# Patient Record
Sex: Male | Born: 2000 | Race: Black or African American | Hispanic: No | Marital: Single | Smoking: Never smoker
Health system: Southern US, Community
[De-identification: ages and names within clinical notes are randomized; demographics above are authoritative.]

## PROBLEM LIST (undated history)

## (undated) ENCOUNTER — Ambulatory Visit: Payer: Self-pay

## (undated) ENCOUNTER — Ambulatory Visit: Admission: EM | Discharge status: Absent without leave | Payer: Self-pay

---

## 2021-06-15 ENCOUNTER — Ambulatory Visit: Admit: 2021-06-15 | Payer: Self-pay

## 2021-06-15 ENCOUNTER — Ambulatory Visit: Admission: EM | Admit: 2021-06-15 | Payer: Self-pay

## 2021-06-15 ENCOUNTER — Other Ambulatory Visit: Payer: Self-pay

## 2021-06-15 ENCOUNTER — Other Ambulatory Visit: Payer: Self-pay | Admitting: Emergency Medicine

## 2021-06-15 ENCOUNTER — Ambulatory Visit (HOSPITAL_BASED_OUTPATIENT_CLINIC_OR_DEPARTMENT_OTHER)
Admission: RE | Admit: 2021-06-15 | Discharge: 2021-06-15 | Disposition: A | Discharge status: Home / Self Care | Payer: 59 | Source: Ambulatory Visit | Attending: Emergency Medicine | Admitting: Emergency Medicine

## 2021-06-15 ENCOUNTER — Ambulatory Visit
Admission: EM | Admit: 2021-06-15 | Discharge: 2021-06-15 | Disposition: A | Discharge status: Home / Self Care | Payer: 59

## 2021-06-15 ENCOUNTER — Ambulatory Visit
Admission: EM | Admit: 2021-06-15 | Discharge: 2021-06-15 | Discharge status: Absent without leave | Payer: 59 | Source: Home / Self Care | Attending: Emergency Medicine | Admitting: Emergency Medicine

## 2021-06-15 ENCOUNTER — Encounter: Payer: Self-pay | Admitting: Emergency Medicine

## 2021-06-15 DIAGNOSIS — T1490XA Injury, unspecified, initial encounter: Secondary | ICD-10-CM | POA: Insufficient documentation

## 2021-06-15 DIAGNOSIS — S99911A Unspecified injury of right ankle, initial encounter: Secondary | ICD-10-CM | POA: Diagnosis not present

## 2021-06-15 DIAGNOSIS — S93401A Sprain of unspecified ligament of right ankle, initial encounter: Secondary | ICD-10-CM

## 2021-06-15 NOTE — ED Notes (Addendum)
This writer was unable to transfer patient into a room due to error message on Epic. This Diplomatic Services operational officer aware.This Clinical research associate is waiting for registration to make a correction on patients chart before triage can occur.

## 2021-06-15 NOTE — ED Provider Notes (Signed)
[facility]    CSN: [id number] Arrival date & time: 06/15/21  1556      History   Chief Complaint Chief Complaint  Patient presents with   Ankle Pain    HPI Wesley Morris is a 20 y.o. male presenting today for evaluation of right ankle injury.  Reports that last night he was playing soccer was kicked in the ankle and caused his foot to rotate into a position causing pain.  Since he has had pain with weightbearing.  Continued pain daily to the inner aspect of his ankle, but also on the outside.  Denies history of prior fractures.  HPI  History reviewed. No pertinent past medical history.  There are no problems to display for this patient.   History reviewed. No pertinent surgical history.     Home Medications    Prior to Admission medications   Not on File    Family History History reviewed. No pertinent family history.  Social History Social History   Tobacco Use   Smoking status: Never   Smokeless tobacco: Never  Substance Use Topics   Alcohol use: Never   Drug use: Never     Allergies   Patient has no known allergies.   Review of Systems Review of Systems  Constitutional:  Negative for fatigue and fever.  Eyes:  Negative for redness, itching and visual disturbance.  Respiratory:  Negative for shortness of breath.   Cardiovascular:  Negative for chest pain and leg swelling.  Gastrointestinal:  Negative for nausea and vomiting.  Musculoskeletal:  Positive for arthralgias and joint swelling. Negative for myalgias.  Skin:  Negative for color change, rash and wound.  Neurological:  Negative for dizziness, syncope, weakness, light-headedness and headaches.    Physical Exam Triage Vital Signs ED Triage Vitals  Enc Vitals Group     BP 06/15/21 1032 (!) 143/96     Pulse Rate 06/15/21 1032 68     Resp 06/15/21 1032 18     Temp 06/15/21 1032 98.6 F (37 C)     Temp Source 06/15/21 1032 Oral     SpO2 06/15/21 1032 97 %     Weight --       Height --      Head Circumference --      Peak Flow --      Pain Score 06/15/21 1033 9     Pain Loc --      Pain Edu? --      Excl. in GC? --    No data found.  Updated Vital Signs BP (!) 143/96 (BP Location: Left Arm)   Pulse 68   Temp 98.6 F (37 C) (Oral)   Resp 18   SpO2 97%   Visual Acuity Right Eye Distance:   Left Eye Distance:   Bilateral Distance:    Right Eye Near:   Left Eye Near:    Bilateral Near:     Physical Exam Vitals and nursing note reviewed.  Constitutional:      Appearance: He is well-developed.     Comments: No acute distress  HENT:     Head: Normocephalic and atraumatic.     Nose: Nose normal.  Eyes:     Conjunctiva/sclera: Conjunctivae normal.  Cardiovascular:     Rate and Rhythm: Normal rate.  Pulmonary:     Effort: Pulmonary effort is normal. No respiratory distress.  Abdominal:     General: There is no distension.  Musculoskeletal:  General: Normal range of motion.     Cervical back: Neck supple.     Comments: Right ankle: No obvious swelling or deformity, tenderness to palpation to medial malleolus, mild tenderness over lateral malleolus as well, nontender anteriorly or throughout dorsum of foot, no tenderness along Achilles, Achilles feels firm and intact, dorsalis pedis 2+  Skin:    General: Skin is warm and dry.  Neurological:     Mental Status: He is alert and oriented to person, place, and time.     UC Treatments / Results  Labs (all labs ordered are listed, but only abnormal results are displayed) Labs Reviewed - No data to display  EKG   Radiology DG Ankle Complete Right  Result Date: 06/15/2021 CLINICAL DATA:  Injury yesterday, medial and lateral malleolar pain EXAM: RIGHT ANKLE - COMPLETE 3+ VIEW COMPARISON:  None. FINDINGS: Frontal, oblique, and lateral views of the right ankle are obtained. No fracture, subluxation, or dislocation. Joint spaces are well preserved. Soft tissues are unremarkable. IMPRESSION:  1. Unremarkable right ankle. Electronically Signed   By: Sharlet Salina M.D.   On: 06/15/2021 17:27    Procedures Procedures (including critical care time)  Medications Ordered in UC Medications - No data to display  Initial Impression / Assessment and Plan / UC Course  I have reviewed the triage vital signs and the nursing notes.  Pertinent labs & imaging results that were available during my care of the patient were reviewed by me and considered in my medical decision making (see chart for details).     Right ankle injury-x-ray pending, x-ray signs of acute fracture, will treat as sprain.  Continue ankle brace patient recently purchased, will provide crutches for comfort, weightbearing as tolerated, ice elevate and anti-inflammatories.  Patient to follow-up if not seeing any better next week.  Discussed strict return precautions. Patient verbalized understanding and is agreeable with plan.  Final Clinical Impressions(s) / UC Diagnoses   Final diagnoses:  Right ankle injury, initial encounter   Discharge Instructions   None    ED Prescriptions   None    PDMP not reviewed this encounter.   Lew Dawes, [place] 06/15/21 1824

## 2021-06-15 NOTE — ED Notes (Signed)
Patient left facility.

## 2021-06-15 NOTE — Discharge Instructions (Addendum)
I will call you once issue with your chart is resolved and I can place order Ice and elevate Use anti-inflammatories for pain/swelling. You may take up to 800 mg Ibuprofen every 8 hours with food. You may supplement Ibuprofen with Tylenol [phone] mg every 8 hours.

## 2021-06-15 NOTE — ED Triage Notes (Signed)
Patient c/o RT sided ankle pain that started last night.   Patient states " when I was playing soccer someone slammed into my ankle".   Patient endorses pain upon ambulation. Patient states "pain is located on the inside of my ankle".   Patient has used compression w/ some relief of symptoms.

## 2021-06-15 NOTE — ED Notes (Signed)
Registration staff has contacted IT, awaiting response.

## 2023-01-02 IMAGING — CR DG ANKLE COMPLETE 3+V*R*
3 series · 3 of 3 positions shown · non-contrast
Comparison: None.

CLINICAL DATA: Injury yesterday, medial and lateral malleolar pain

EXAM:
RIGHT ANKLE - COMPLETE 3+ VIEW

[t ankle joint ap right]
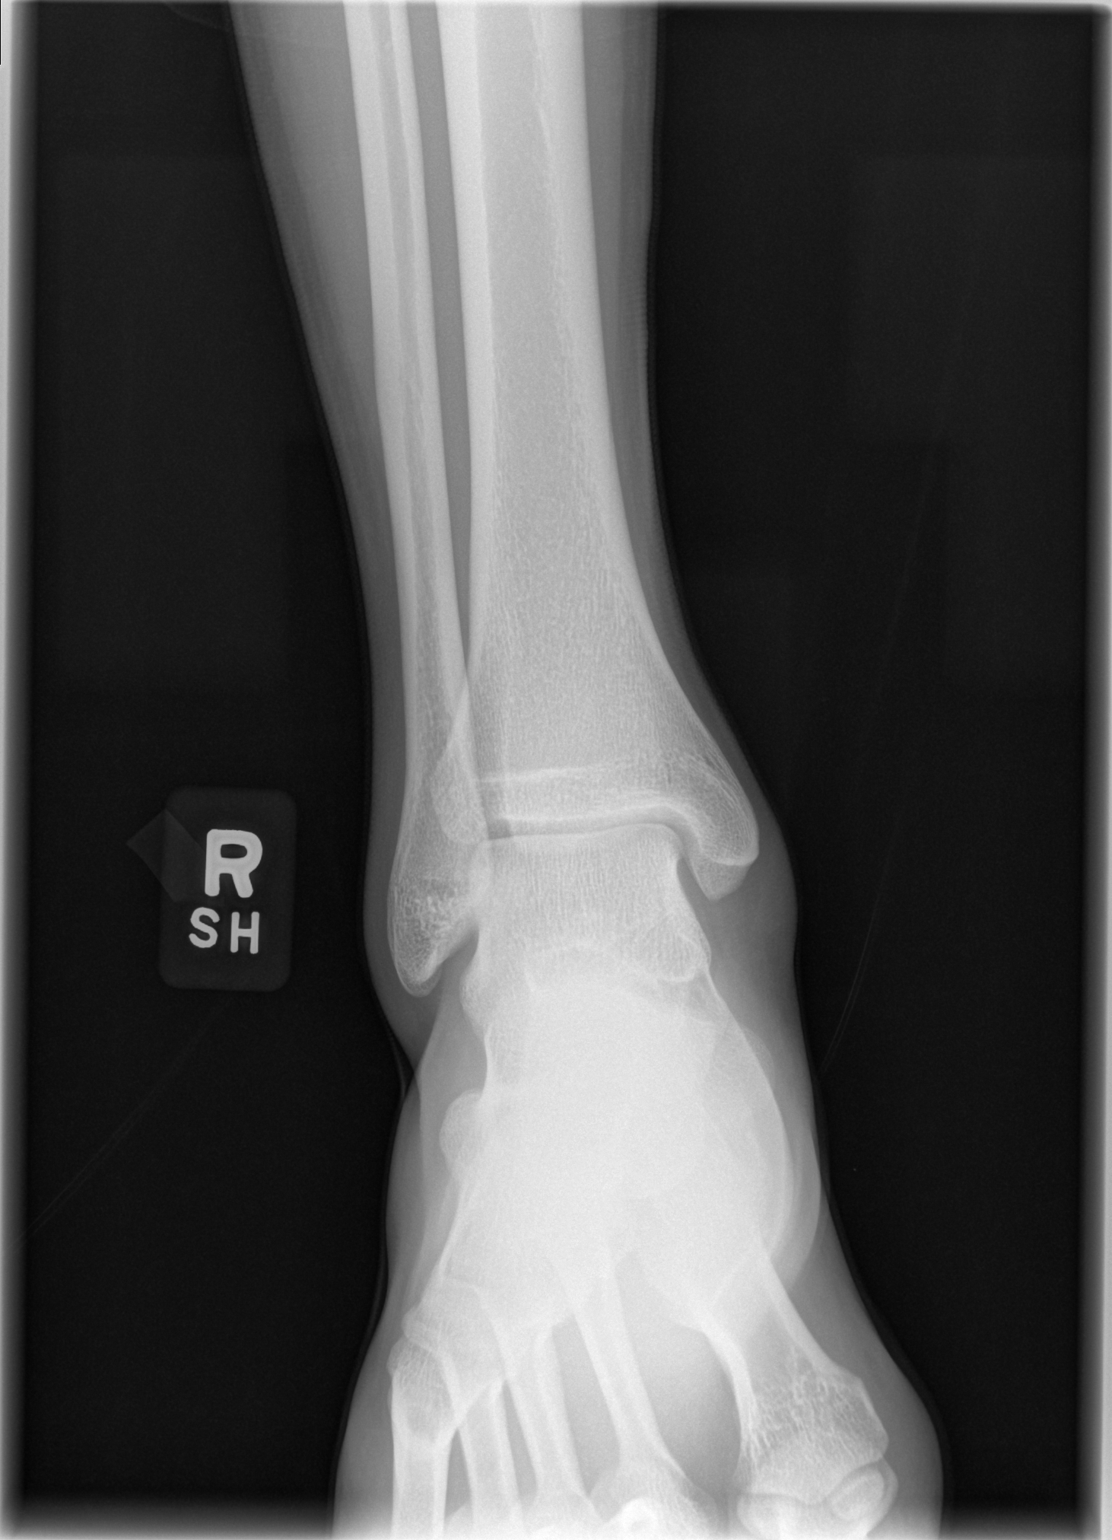

[t ankle joint oblique right]
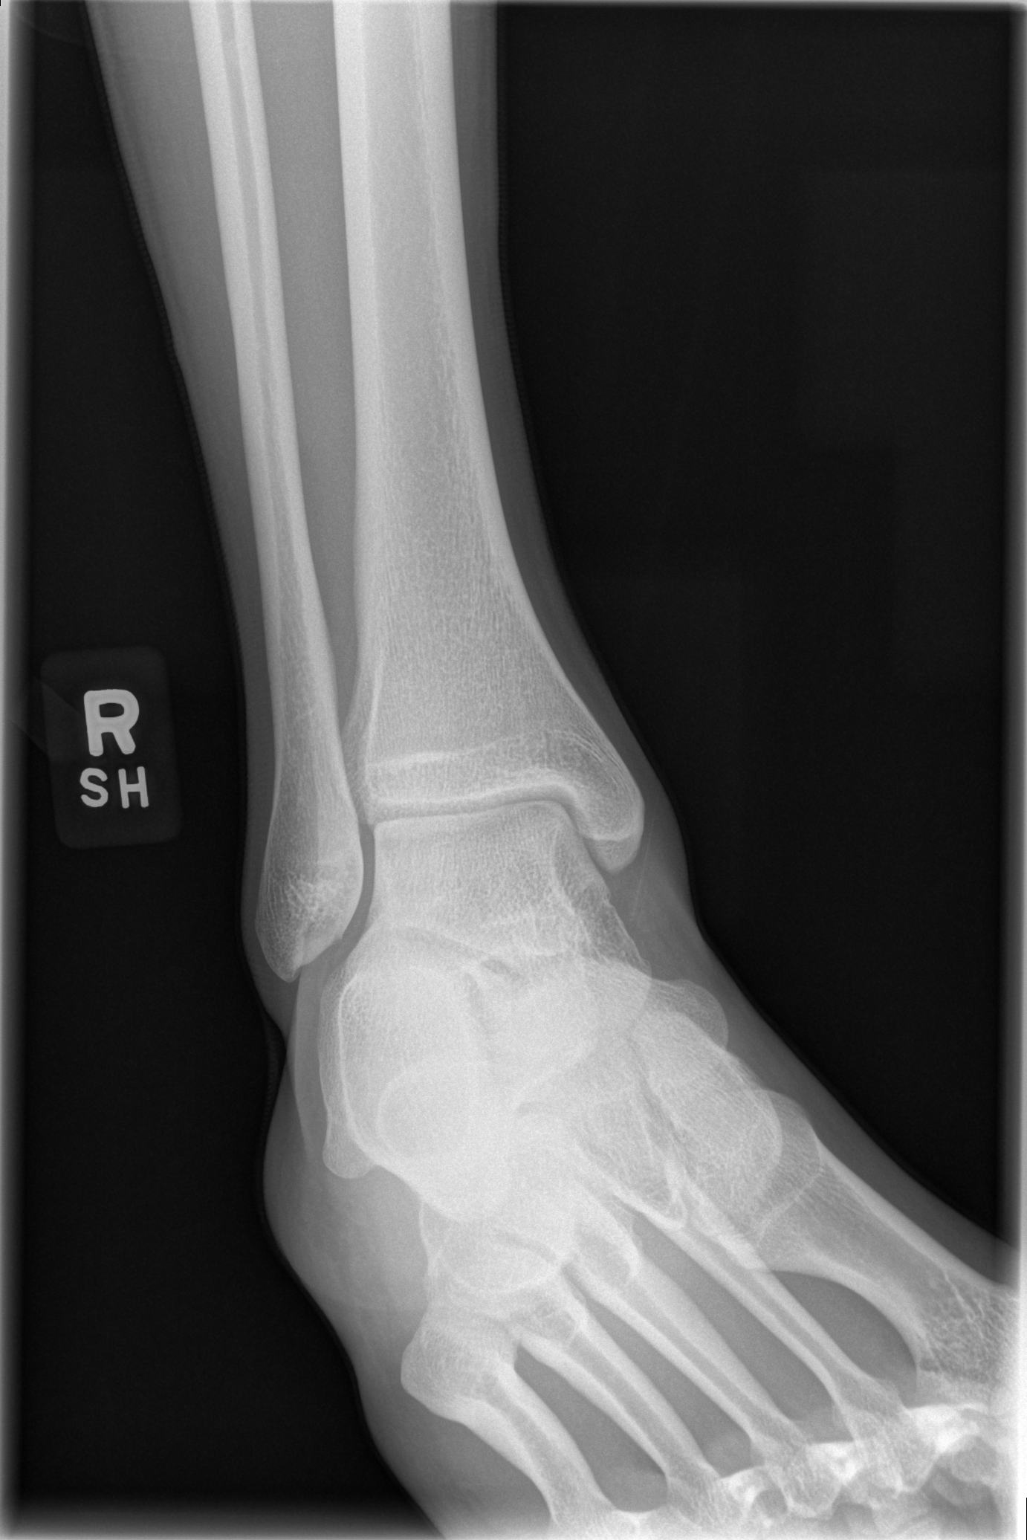

[t ankle joint lat right]
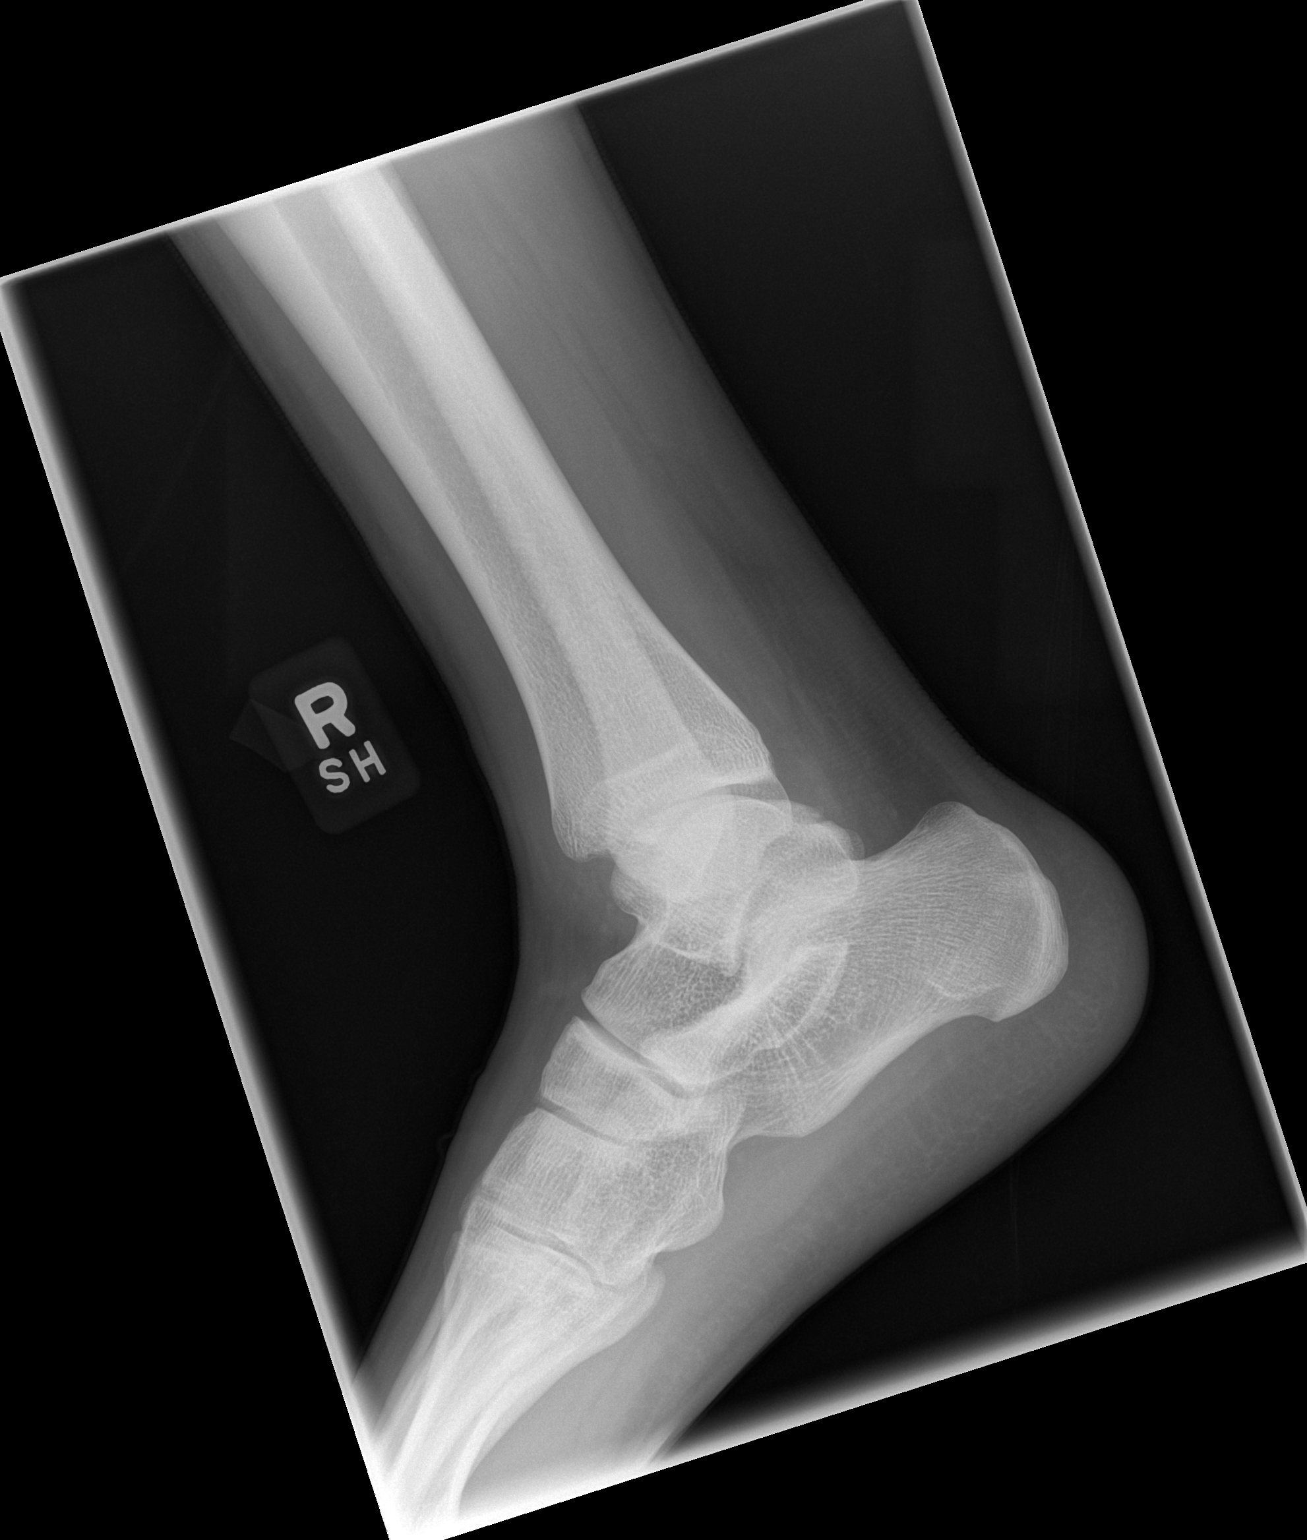

[3 of 3 positions shown; findings below may reference images not displayed]

FINDINGS: Frontal, oblique, and lateral views of the right ankle are obtained.
No fracture, subluxation, or dislocation. Joint spaces are well
preserved. Soft tissues are unremarkable.
IMPRESSION: 1. Unremarkable right ankle.
